# Patient Record
Sex: Male | Born: 1975 | Race: White | Hispanic: No | Marital: Single | Smoking: Current every day smoker
Health system: Southern US, Community
[De-identification: ages and names within clinical notes are randomized; demographics above are authoritative.]

---

## 1998-04-14 ENCOUNTER — Emergency Department (HOSPITAL_COMMUNITY): Admission: EM | Admit: 1998-04-14 | Discharge: 1998-04-14 | Payer: Self-pay | Admitting: Emergency Medicine

## 1998-04-17 ENCOUNTER — Emergency Department (HOSPITAL_COMMUNITY): Admission: EM | Admit: 1998-04-17 | Discharge: 1998-04-17 | Payer: Self-pay | Admitting: Emergency Medicine

## 1998-04-18 ENCOUNTER — Inpatient Hospital Stay (HOSPITAL_COMMUNITY): Admission: EM | Admit: 1998-04-18 | Discharge: 1998-04-21 | Payer: Self-pay | Admitting: Internal Medicine

## 2006-12-11 ENCOUNTER — Emergency Department (HOSPITAL_COMMUNITY): Admission: EM | Admit: 2006-12-11 | Discharge: 2006-12-11 | Payer: Self-pay | Admitting: Emergency Medicine
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
## 2014-05-11 ENCOUNTER — Emergency Department (HOSPITAL_COMMUNITY)
Admission: EM | Admit: 2014-05-11 | Discharge: 2014-05-11 | Disposition: A | Discharge status: Home / Self Care | Payer: Self-pay | Attending: Emergency Medicine | Admitting: Emergency Medicine

## 2014-05-11 ENCOUNTER — Encounter (HOSPITAL_COMMUNITY): Payer: Self-pay | Admitting: Emergency Medicine

## 2014-05-11 DIAGNOSIS — X500XXA Overexertion from strenuous movement or load, initial encounter: Secondary | ICD-10-CM | POA: Insufficient documentation

## 2014-05-11 DIAGNOSIS — M545 Low back pain, unspecified: Secondary | ICD-10-CM

## 2014-05-11 DIAGNOSIS — Z79899 Other long term (current) drug therapy: Secondary | ICD-10-CM | POA: Insufficient documentation

## 2014-05-11 DIAGNOSIS — Y9389 Activity, other specified: Secondary | ICD-10-CM | POA: Insufficient documentation

## 2014-05-11 DIAGNOSIS — Z791 Long term (current) use of non-steroidal anti-inflammatories (NSAID): Secondary | ICD-10-CM | POA: Insufficient documentation

## 2014-05-11 DIAGNOSIS — Y9289 Other specified places as the place of occurrence of the external cause: Secondary | ICD-10-CM | POA: Insufficient documentation

## 2014-05-11 DIAGNOSIS — F17200 Nicotine dependence, unspecified, uncomplicated: Secondary | ICD-10-CM | POA: Insufficient documentation

## 2014-05-11 DIAGNOSIS — IMO0002 Reserved for concepts with insufficient information to code with codable children: Secondary | ICD-10-CM | POA: Insufficient documentation

## 2014-05-11 MED ORDER — OXYCODONE-ACETAMINOPHEN 5-325 MG PO TABS: 1.0000 | ORAL_TABLET | Freq: Four times a day (QID) | ORAL | Status: AC | PRN

## 2014-05-11 MED ORDER — OXYCODONE-ACETAMINOPHEN 5-325 MG PO TABS
2.0000 | ORAL_TABLET | Freq: Once | ORAL | Status: AC
  Administered 2014-05-11: 2 via ORAL
  Filled 2014-05-11: qty 2

## 2014-05-11 MED ORDER — CYCLOBENZAPRINE HCL 10 MG PO TABS: 10.0000 mg | ORAL_TABLET | Freq: Two times a day (BID) | ORAL | Status: AC | PRN

## 2014-05-11 MED ORDER — IBUPROFEN 800 MG PO TABS: 800.0000 mg | ORAL_TABLET | Freq: Three times a day (TID) | ORAL | Status: AC

## 2014-05-11 NOTE — ED Notes (Addendum)
Pt reports lower back pain this am that started when he was carrying boxes in to his home. Denies radiating pain down either leg. Pt denies fall or injury.

## 2014-05-11 NOTE — ED Provider Notes (Signed)
CSN: [id number]     Arrival date & time 05/11/14  1152 History  This chart was scribed for non-physician practitioner, Ladona Mow, PA-C working with Gerhard Munch, MD by Greggory Stallion, ED scribe. This patient was seen in room WTR9/WTR9 and the patient's care was started at 12:22 PM.   Chief Complaint  Patient presents with  . Back Pain   The history is provided by the patient. No language interpreter was used.   HPI Comments: Caleb Murphy is a 38 y.o. male who presents to the Emergency Department complaining of sudden onset lower back pain that started earlier this morning. Pt states he was carrying heavy boxes up the stairs when the pain started. Denies fall. Pain does not radiate. Movement and bearing weight worsen the pain. He has not taken anything for the pain. Denies difficulty urinating, numbness or tingling. Pt has injured his back in the same place in the past but was never evaluated for it. Reports family history of cancer but denies personal history of it. Denies history of IV drug use. Reports occasional marijuana and alcohol use.   History reviewed. No pertinent past medical history. History reviewed. No pertinent past surgical history. No family history on file. History  Substance Use Topics  . Smoking status: Current Every Day Smoker  . Smokeless tobacco: Not on file  . Alcohol Use: Yes    Review of Systems  Genitourinary: Negative for difficulty urinating.  Musculoskeletal: Positive for back pain.  Neurological: Negative for numbness.  All other systems reviewed and are negative.  Allergies  Review of patient's allergies indicates no known allergies.  Home Medications   Prior to Admission medications   Medication Sig Start Date End Date Taking? Authorizing Provider  cyclobenzaprine (FLEXERIL) 10 MG tablet Take 1 tablet (10 mg total) by mouth 2 (two) times daily as needed for muscle spasms. 05/11/14   Monte Fantasia, PA-C  ibuprofen (ADVIL,MOTRIN) 800 MG  tablet Take 1 tablet (800 mg total) by mouth 3 (three) times daily. 05/11/14   Monte Fantasia, PA-C  oxyCODONE-acetaminophen (PERCOCET) 5-325 MG per tablet Take 1-2 tablets by mouth every 6 (six) hours as needed. 05/11/14   Monte Fantasia, PA-C   BP 121/58  Pulse 71  Temp(Src) 98.6 F (37 C) (Oral)  Resp 16  SpO2 98%  Physical Exam  Nursing note and vitals reviewed. Constitutional: He is oriented to person, place, and time. He appears well-developed and well-nourished. No distress.  HENT:  Head: Normocephalic and atraumatic.  Eyes: Conjunctivae and EOM are normal.  Neck: Neck supple. No tracheal deviation present.  Cardiovascular: Normal rate, regular rhythm and normal heart sounds.   Pulmonary/Chest: Effort normal and breath sounds normal. No respiratory distress. He has no wheezes. He has no rhonchi. He has no rales.  Abdominal: Soft. There is no tenderness.  Musculoskeletal: Normal range of motion.  Patient's pain diffusely reproducible midline along his lower lumbar and upper sacral region. No obvious trauma, ecchymosis, deformity, step offs, swelling, erythema noted on exam. Range of motion limited due to the patient's pain.  Neurological: He is alert and oriented to person, place, and time. He has normal strength and normal reflexes. No cranial nerve deficit or sensory deficit. He displays a negative Romberg sign. Gait normal.  Motor strength 5 out of 5 in major muscle groups of hip, knee, ankle. Normal gait. Distal sensation intact with no radicular signs or symptoms.  Skin: Skin is warm and dry.  Psychiatric: He has a normal mood  and affect. His behavior is normal.    ED Course  Procedures (including critical care time)  DIAGNOSTIC STUDIES: Oxygen Saturation is 98% on RA, normal by my interpretation.    COORDINATION OF CARE: 12:27 PM-Discussed treatment plan which includes pain medication and xray with pt at bedside and pt agreed to plan.   Labs Review Labs Reviewed - No  data to display  Imaging Review No results found.   EKG Interpretation None      MDM   Final diagnoses:  Midline low back pain without sciatica    38 year old male with history of previous back pain coming and presenting with acute onset of similar back pain that he's had in the past. Patient states he was lifting boxes and noticed a sudden onset of the pain as he was carrying boxes up steps. Imaging deferred at this time, will treat patient's pain.  No neurological deficits and normal neuro exam.  Patient can walk but states is painful.  No loss of bowel or bladder control.  No concern for cauda equina.  No fever, night sweats, weight loss, h/o cancer, IVDU.  RICE protocol and pain medicine indicated and discussed with patient. We recommend patient followup with a PCP for his back pain, for further evaluation and treatment should his symptoms persist the patient states he does not currently have a PCP and we provided him with the resource guide to help find a PCP in the area. Patient is agreeable to this plan. We encouraged patient to call or return to the ER should his symptoms continue, worsen or should he have any questions or concerns.    Signed,  Ladona MowJoe Nadim Malia, PA-C 9:11 PM    I personally performed the services described in this documentation, which was scribed in my presence. The recorded information has been reviewed and is accurate.  Monte FantasiaJoseph W Glen Blatchley, PA-C 05/11/14 2111

## 2014-05-11 NOTE — Discharge Instructions (Signed)
Take Ibuprofen, use heating pad/ice as needed for pain.  Follow up with Primary Care Provider and reference to help locate one that will accept your insurance.  Return to the ER should your symptoms worsen.     Back Pain, Adult Low back pain is very common. About 1 in 5 people have back pain.The cause of low back pain is rarely dangerous. The pain often gets better over time.About half of people with a sudden onset of back pain feel better in just 2 weeks. About 8 in 10 people feel better by 6 weeks.  CAUSES Some common causes of back pain include:  Strain of the muscles or ligaments supporting the spine.  Wear and tear (degeneration) of the spinal discs.  Arthritis.  Direct injury to the back. DIAGNOSIS Most of the time, the direct cause of low back pain is not known.However, back pain can be treated effectively even when the exact cause of the pain is unknown.Answering your caregiver's questions about your overall health and symptoms is one of the most accurate ways to make sure the cause of your pain is not dangerous. If your caregiver needs more information, he or she may order lab work or imaging tests (X-rays or MRIs).However, even if imaging tests show changes in your back, this usually does not require surgery. HOME CARE INSTRUCTIONS For many people, back pain returns.Since low back pain is rarely dangerous, it is often a condition that people can learn to [facility] their own.   Remain active. It is stressful on the back to sit or stand in one place. Do not sit, drive, or stand in one place for more than 30 minutes at a time. Take short walks on level surfaces as soon as pain allows.Try to increase the length of time you walk each day.  Do not stay in bed.Resting more than 1 or 2 days can delay your recovery.  Do not avoid exercise or work.Your body is made to move.It is not dangerous to be active, even though your back may hurt.Your back will likely heal faster if you  return to being active before your pain is gone.  Pay attention to your body when you bend and lift. Many people have less discomfortwhen lifting if they bend their knees, keep the load close to their bodies,and avoid twisting. Often, the most comfortable positions are those that put less stress on your recovering back.  Find a comfortable position to sleep. Use a firm mattress and lie on your side with your knees slightly bent. If you lie on your back, put a pillow under your knees.  Only take over-the-counter or prescription medicines as directed by your caregiver. Over-the-counter medicines to reduce pain and inflammation are often the most helpful.Your caregiver may prescribe muscle relaxant drugs.These medicines help dull your pain so you can more quickly return to your normal activities and healthy exercise.  Put ice on the injured area.  Put ice in a plastic bag.  Place a towel between your skin and the bag.  Leave the ice on for 15-20 minutes, 03-04 times a day for the first 2 to 3 days. After that, ice and heat may be alternated to reduce pain and spasms.  Ask your caregiver about trying back exercises and gentle massage. This may be of some benefit.  Avoid feeling anxious or stressed.Stress increases muscle tension and can worsen back pain.It is important to recognize when you are anxious or stressed and learn ways to manage it.Exercise is a great option.  SEEK MEDICAL CARE IF:  You have pain that is not relieved with rest or medicine.  You have pain that does not improve in 1 week.  You have new symptoms.  You are generally not feeling well. SEEK IMMEDIATE MEDICAL CARE IF:   You have pain that radiates from your back into your legs.  You develop new bowel or bladder control problems.  You have unusual weakness or numbness in your arms or legs.  You develop nausea or vomiting.  You develop abdominal pain.  You feel faint. Document Released: 09/09/2005  Document Revised: 03/10/2012 Document Reviewed: 01/11/2014 [facility] Patient Information 2015 [place], [place]. This information is not intended to replace advice given to you by your health care provider. Make sure you discuss any questions you have with your health care provider.    Emergency Department Resource Guide 1) Find a Doctor and Pay Out of Pocket Although you won't have to find out who is covered by your insurance plan, it is a good idea to ask around and get recommendations. You will then need to call the office and see if the doctor you have chosen will accept you as a new patient and what types of options they offer for patients who are self-pay. Some doctors offer discounts or will set up payment plans for their patients who do not have insurance, but you will need to ask so you aren't surprised when you get to your appointment.  2) Contact Your Local Health Department Not all health departments have doctors that can see patients for sick visits, but many do, so it is worth a call to see if yours does. If you don't know where your local health department is, you can check in your phone book. The CDC also has a tool to help you locate your state's health department, and many state websites also have listings of all of their local health departments.  3) Find a Walk-in Clinic If your illness is not likely to be very severe or complicated, you may want to try a walk in clinic. These are popping up all over the country in pharmacies, drugstores, and shopping centers. They're usually staffed by nurse practitioners or physician assistants that have been trained to treat common illnesses and complaints. They're usually fairly quick and inexpensive. However, if you have serious medical issues or chronic medical problems, these are probably not your best option.  No Primary Care Doctor: - Call Health Connect at  [phone] - they can help you locate a primary care doctor that  accepts your  insurance, provides certain services, etc. - Physician Referral Service- [phone]  Chronic Pain Problems: Organization         Address  Phone   Notes  [facility]  [phone] Patients need to be referred by their primary care doctor.   Medication Assistance: Organization         Address  Phone   Notes  [facility] Medication [facility] [address] [place], [place] [address] [phone] --Must be a resident of [facility] -- Must have NO insurance coverage whatsoever (no Medicaid/ Medicare, etc.) -- The pt. MUST have a primary care doctor that directs their care regularly and follows them in the community   [facility]ist  [phone]   [employer]  [phone]    Agencies that provide inexpensive medical care: Organization         Address  Phone   Notes  Redge Gainer  Family Medicine  [phone](336) [phone]   [facility]    [phone]) [phone]   [facility] [address] [place], [place] [id number] [phone]) [phone]   [facility] [place] [address], [place] [phone]) [phone]   [facility]anned [facility]    [phone](336) [phone]   [facility]    [phone]   [facility]  [address], [place] Phone:  [phone]) [phone], Fax:  [phone] Hours of Operation:  9 am - 6 pm, M-F.  Also accepts Medicaid/Medicare and self-pay.  [facility]  [address], [place] Phone: [phone](336) [phone], Fax: [phone](336) [phone]. Hours of Operation:  8:30 am - 5:30 pm, M-F.  Also accepts Medicaid and self-pay.  [facility] [place] [address], [place] Phone: [phone](336) [phone]   [facility] [address], [place], [place] [phone], Ext. [phone] Mondays & Thursdays: 7-9 AM.  First 15 patients are seen on a first come, first serve basis.    Medicaid-accepting [facility] Providers:  Organization          Address  Phone   Notes  [facility] [address]  [phone]) [phone] Also accepts self-pay patients.  [facility] [address], [place]  [phone]) [phone]   [facility] [address], [place] [phone](336) [phone]   [facility] [address] [place] [address], [place] [phone](336) [phone]   Renaye RakersVeita Bland [address], [place]   [phone](336) [phone] Only accepts [place] Access [place] patients after they have their name applied to their card.   Self-Pay (no insurance) in [facility]:  Organization         Address  Phone   Notes  Sickle Cell Patients, [facility] [address], [place] [phone]) [phone]   [facility] [address], [place] [phone]) [phone]   [facility]  [address], [place] [phone]   [facility]/Dr. Osei-Bonsu  [address] [place] [address], [place] or [phone], [place] [phone]) [phone] Phone number for both [place] and [place] locations is the same.  [facility] [address], [place] [phone](336) [phone]   [facility]  [address] [place] [address], [place] or [address] [phone]) [phone]   [facility] [address], [place] [phone](336) [phone], phone; [phone](336) [phone], fax Sees patients 1st and 3rd Saturday of every month.  Must not qualify for public or private insurance (i.e. Medicaid, Medicare, [place] Health Choice, Veterans' Benefits)  Household income should be no more than 200% of the poverty level The clinic cannot treat you if you are pregnant or think you are pregnant  Sexually transmitted diseases are not treated at the clinic.    Dental Care: Organization         Address  Phone  Notes  [facility] Department of [facility] [address],  [place] [phone]) [phone] Accepts children up to age 37 who are enrolled in [place] or [place] Health Choice; pregnant women with a Medicaid card; and children who have applied for Medicaid or [place] Health Choice, but were declined, whose parents can pay a reduced fee at time of service.  [facility] Department of [employer]  [place]  [address], [place] [phone] Accepts children up to age 25 who are enrolled in Medicaid or [place] Health Choice; pregnant women with a Medicaid card; and children who have applied for Medicaid or [place] Health Choice, but were declined, whose parents can pay a reduced fee at time of service.  [facility]  [address] [place], [place] [phone] Patients are seen by appointment only. Walk-ins are not accepted. [facility] will see patients 38 years of age and older. Monday - Tuesday (8am-5pm) Most Wednesdays (8:30-5pm) $30 per visit, cash only  [facility] Access [facility]  [address], [facility] [phone] Patients are seen by appointment only. Walk-ins are not accepted. [facility] will see patients 10 years of age and older. One Wednesday Evening (Monthly: Volunteer Based).  $30 per visit, cash only  [employer]  [phone] for adults; Children under age 9, call Graduate Pediatric Dentistry at [phone]. Children aged 70-14, please call [phone] to request a pediatric application.  Dental services are provided in all areas of dental care including fillings, crowns and bridges, complete and partial dentures, implants, gum treatment, root canals, and extractions. Preventive care is also provided. Treatment is provided to both adults and children. Patients are selected via a lottery and there is often a waiting list.   [facility] [address] [place]  [phone] [email or web address]   [facility] [address] [place], [place]  [phone], Ext. [phone] Second and Fourth Thursday of each month, opens at 6:30 AM; Clinic ends at 9 AM.  Patients are seen on a first-come first-served basis, and a limited number are seen during each clinic.   [facility]  [address] [place], [place] [phone]   Eligibility Requirements You must have lived in [place], [place], or [place] counties for at least the last three months.   You cannot be eligible for state or federal sponsored [place], including [employer], [place], or [employer].   You generally cannot be eligible for healthcare insurance through your employer.    How to apply: Eligibility screenings are held every Tuesday and Wednesday afternoon from 1:00 pm until 4:00 pm. You do not need an appointment for the interview!  [facility] [address] [place], [place] [phone]   [facility] Health Department  [phone]   [facility] Health Department  [phone]   [facility] Health Department  [phone]    Behavioral Health Resources in the Community: Intensive Outpatient Programs Organization         Address  Phone  Notes  [facility] [address] [place], [place] [phone]   [facility] [address] [place], [place] [phone]   [facility] [address], [place], [place]  [phone]   [facility] [address]  [place], [place] [phone] or [phone]   Substance Abuse Resources Organization         Address  Phone  Notes  Al[facility] Drug Services  [phone]   [facility]  [phone]   [facility] [place]  [phone]   Floydene Flock  [phone]   Residential & Outpatient Substance Abuse Program  [phone]   Psychological Services Organization         Address  Phone  Notes  [facility] Health  [phone]  [employer] Services  [phone]    [facility] Mental Health [address] [place] [phone] or [phone]    Mobile Crisis Teams Organization         Address  Phone  Notes  [facility], Mobile Crisis Care Unit  [phone]   [facility]  [address] [place], [place] [phone]   Doristine Locks [address] [place] [phone]    Self-Help/Support Groups Organization         Address  Phone             Notes  Mental Health Assoc. of  - variety of support groups  336- I7437963 Call for more information  Narcotics Anonymous (NA), [facility] [address], [employer] [place]  2 meetings at this location   Statistician         Address  Phone  Notes  [facility] [address],    [place]  [phone]   [facility]  [address], [place] [address], [place], [place] [phone]   [facility] [address] [place], [place] [phone] Admissions: 8am-3pm M-F  [facility] [address],    [place], [place] [phone]   [facility] [address] [place], [place], [place] [phone]   [facility] [address],  [place], [place] [phone]   [facility] - Intensive Outpatient [address], [place], [place] [phone]   [facility] [address] [place]  [place], [place] [phone] or [phone]   [facility] ([facility]) [address] [place], [place] [phone] Accepts Medicaid  [facility] [address],  [place] [phone] Substance Abuse/Addiction Treatment   [facility] Organization         Address  Phone  Notes  [facility]  [phone]   Angie Fava, PhD [address]vin [address] [place], [place]   [phone] or [phone]) [phone]   [facility] Behavioral   [address] [place], [place] [phone]   [facility] [address] [place], [place] [phone] Insurance/Medicaid/sponsorship through [facility] and Families [address]                                    [place], [place] [phone] Therapy/tele-psych/case  [facility] [address] [place] [place], [place] [phone]    Dr. Lolly Mustache  [phone]   [facility]  [employer] [facility] Dept. 1) [address] [place] 2) [address], [place] 3)  [address], [place] [phone]  [phone]   [facility] Child Abuse Hotline 256-706-2907 or [phone] (After Hours)

## 2014-05-12 NOTE — ED Provider Notes (Signed)
  Medical screening examination/treatment/procedure(s) were performed by non-physician practitioner and as supervising physician I was immediately available for consultation/collaboration.   EKG Interpretation None         Gerhard Munchobert Sebrena Engh, MD 05/12/14 [phone]

## 2014-10-28 ENCOUNTER — Emergency Department (HOSPITAL_COMMUNITY)
Admission: EM | Admit: 2014-10-28 | Discharge: 2014-10-28 | Discharge status: Absent without leave | Payer: 59 | Source: Home / Self Care
